# Patient Record
Sex: Male | Born: 1965 | Race: White | Hispanic: No | Marital: Married
Health system: Midwestern US, Community
[De-identification: ages and names within clinical notes are randomized; demographics above are authoritative.]

## PROBLEM LIST (undated history)

## (undated) DIAGNOSIS — M79672 Pain in left foot: Secondary | ICD-10-CM

---

## 2009-01-31 ENCOUNTER — Inpatient Hospital Stay
Admit: 2009-01-31 | Discharge: 2009-01-31 | Disposition: A | Discharge status: Home / Self Care | Attending: Emergency Medicine

## 2009-01-31 MED ORDER — IBUPROFEN 600 MG PO TABS
600 MG | Freq: Once | ORAL | Status: AC
Start: 2009-01-31 — End: 2009-01-31
  Administered 2009-01-31: 17:00:00 600 mg via ORAL

## 2009-01-31 MED ORDER — BACITRACIN-POLYMYXIN B 500-10000 UNIT/GM EX OINT
500-10000 | CUTANEOUS | Status: AC
Start: 2009-01-31 — End: ?

## 2009-01-31 MED ORDER — TETANUS-DIPHTHERIA TOXOIDS TD 2-2 LF/0.5ML IM SUSP
2-2 | INTRAMUSCULAR | Status: AC
Start: 2009-01-31 — End: ?

## 2009-01-31 MED ORDER — TETANUS-DIPHTHERIA TOXOIDS TD 2-2 LF/0.5ML IM SUSP
2-2 LF/0.5ML | Freq: Once | INTRAMUSCULAR | Status: AC
Start: 2009-01-31 — End: 2009-01-31
  Administered 2009-01-31: 17:00:00 0.5 mL via INTRAMUSCULAR

## 2009-01-31 MED ORDER — LIDOCAINE-EPINEPHRINE 1 %-1:100000 IJ SOLN
1 | INTRAMUSCULAR | Status: AC
Start: 2009-01-31 — End: ?

## 2009-01-31 MED ORDER — IBUPROFEN 600 MG PO TABS
600 | ORAL | Status: AC
Start: 2009-01-31 — End: ?

## 2009-01-31 MED FILL — IBUPROFEN 600 MG PO TABS: 600 MG | ORAL | Qty: 1

## 2009-01-31 MED FILL — LIDOCAINE-EPINEPHRINE 1-1:100000 % IJ SOLN: INTRAMUSCULAR | Qty: 20

## 2009-01-31 MED FILL — TETANUS-DIPHTHERIA TOXOIDS TD 2-2 LF/0.5ML IM SUSP: 2-2 LF/0.5ML | INTRAMUSCULAR | Qty: 0.5
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
## 2009-01-31 NOTE — ED Notes (Signed)
Pt to Ct

## 2009-01-31 NOTE — ED Notes (Signed)
Pt returned from CT

## 2009-01-31 NOTE — Discharge Instructions (Signed)
Minor Head Injury   WHAT YOU SHOULD KNOW:   A minor head injury can cause the brain to have trouble working normally for a short time. Minor head injuries are usually not a serious problem. They are most often caused by a blow to the head. A minor head injury may happen because of a fall, a motor vehicle crash, or a sports injury. Sometimes being forcefully shaken may cause a minor head injury.  INSTRUCTIONS:   Medicines:    Keep a written list of what medicines you take, the amounts, and when and why they are taken. Bring the list of your medicines or the pill bottles when you visit your caregiver. Ask your caregiver for more information about the medicines. Do not take any other medicines without first asking your caregiver. This includes prescriptions, over-the-counter drugs, vitamins, herbs, or food supplements.      Always take your medicine as directed by caregivers. Call your caregiver if you think the medicines are not helping. Call if you feel you are having side effects. Do not quit taking the medicines until you discuss it with your caregiver.     Take acetaminophen (a-seet-a-MIN-oh-fen) or ibuprofen (i-bu-PRO-fen) for headache or neck pain if your caregiver says it is OK.   Keep all appointments:   Ask your caregiver when to return for a follow-up visit. Keep all appointments. Write down any questions you may have. This way you will remember to ask these questions during your next visit.  Care:    Waking: You will need to have someone wake you at different times during the night. Ask your caregiver how often you need to be woken up and for how long. Also, have them ask you a few questions to see if you are thinking clearly. An example would be to ask your name or your address.     Ice: Sometimes a blow to the head may cause bruising, swelling, or a cut on your skin. A caregiver may suggest that you use ice to decrease your pain or swelling. It is best to start using ice right after an injury  and up to 24 to 48 hours afterwards. Ask what type of ice treatment to use and how long you should use it. Do not use ice directly on the skin, or for longer than 20 minutes at a time. If ice is not covered or is put on one area of your body for too long, it may cause frostbite.     Rest: Rest in bed or do quiet activities for the first 24 hours. You may begin normal activities again after you feel better.     Activities: Ask your caregiver when you can return to your normal activities such as work or sports.   CONTACT A CAREGIVER IF:    You are vomiting.     You seem more sleepy, or are harder to wake up than usual.     Your symptoms get worse during the first several days after the injury.     You have new headaches that are very bad, or that get worse in the days after the injury.     Your symptoms last longer than six weeks after the injury.   SEEK CARE IMMEDIATELY IF:    You should be seen in an emergency room, doctor's office, or clinic immediately if:      You are vomiting.     You have increasing confusion or a change in personality.       You have blood or clear fluid coming out of the ears or nose.     You do not know where you are, or you do not recognize people that are familiar.     You have new problems with vision (blurry or double vision).     Your speech becomes slurred or confused.     You have arm or leg weakness, loss of feeling, or new problems with coordination (balance and movement).      You or someone with you should dial 9-1-1 or 0 (Operator) for an ambulance if :      Your pupils (black part in the center of the eye) are unequal in size, and this is new for you.     You have a seizure (convulsion).     Someone tries to wake you and cannot do so.     You stop responding others or you pass out (faint).   Copyright  2009. [employer]. All rights reserved. Information is for End User's use only and may not be sold, redistributed or otherwise used for commercial  purposes.  The above information is an educational aid only. It is not intended as medical advice for individual conditions or treatments. Talk to your doctor, nurse or pharmacist before following any medical regimen to see if it is safe and effective for you.    Laceration   WHAT YOU SHOULD KNOW:    A laceration (las-e-RAY-shun) is a cut in the skin. A laceration can happen anywhere on the body. It may be large or small. It may hurt, feel numb, look swollen, and may bleed. The edges of the cut may be wide open or close together. Lacerations are usually caused by being struck or by hitting something sharp. You may get a laceration if you fall or are in an accident.     Lacerations need to be stitched if they are very deep or are bleeding a lot. Stitches may help to keep the wound from getting infected. A stitched wound usually causes less scarring when healed. If you wait too long to be seen by caregivers and the wound is too old, it may not be stitched. Some lacerations may heal better without stitches. The healing time for a laceration depends on where it is on the body. It may take a laceration longer to heal if it is over a joint, such as your knee or elbow.   INSTRUCTIONS:   Medicines:    Keep a list of your medicines: Keep a written list of the medicines you take, the amounts, and when and why you take them. Bring the list of your medicines or the pill bottles when you see your caregivers. Do not take any medicines, over-the-counter drugs, vitamins, herbs, or food supplements without first talking to caregivers.      Take your medicine as directed: Always take your medicine as directed by caregivers. Call your caregiver if you think your medicines are not helping or if you feel you are having side effects. Do not quit taking your medicines until you discuss it with your caregiver.      Antibiotics: This medicine is given to fight or prevent an infection caused by bacteria. Always take your antibiotics  exactly as ordered by your caregiver. Keep taking this medicine until it is completely gone, even if you feel better. Stopping antibiotics without your caregiver's OK may make the medicine unable to kill all of the germs. Never "save" antibiotics or take leftover antibiotics that   were given to you for another illness.      Over-the-counter pain medicine: You may use over-the-counter (OTC) pain medicines, such as ibuprofen or acetaminophen, for pain or swelling. These medicines may be bought without a caregiver's order. These medicines are safe for most people to use. However, they can cause serious problems when they are not used correctly. People with certain medical conditions, or using certain other medicines are at a higher risk for problems. Using too much, or using these medicines for longer than the label says can also cause problems. Follow directions on the label carefully. If you have questions, talk to your caregiver.      Pain medicine: You may be given medicine to take at home to take away or decrease pain. Your caregiver will tell you how much to take and how often to take it. Take the medicine exactly as directed by your caregiver. Do not wait until the pain is too bad before taking your medicine. The medicine may not work as well at controlling your pain if you wait too long to take it. Tell caregivers if the pain medicine does not help, or if your pain comes back too soon.      If a medicine makes you drowsy: Some medicines may make you drowsy (tired) or less able to think clearly. Avoid driving, signing legal papers, operating heavy equipment or other activities that you must be alert to do. Never drink alcohol while you are taking medicines that make you feel drowsy or less alert.   Ask your caregiver when to return for a follow-up visit. Keep all appointments. Write down any questions you may have. This way you will remember to ask these questions during your next visit.  Home care:    Rest,  ice, and elevation: Avoid using or moving the injured area. If possible, raise the wound up on pillows, above the level of your heart. This helps to decrease pain and swelling. Ice may also help to decrease your pain and swelling. It is best to start using ice right after an injury and up to 24 to 48 hours afterwards. Do not use ice directly on the skin, or for longer than 20 minutes at a time. If ice is not covered or is put on one area for too long, it may cause frostbite.     Caring for your wound: Always follow your caregiver's instructions for wound care. Wash your hands with soap and warm water before and after caring for your wound. Your caregiver may want you to keep the wound dry for the first 24 to 48 hours. After that, gently clean the wound once or twice a day with cool water. Use soap to clean around the wound, but try not to get any on the wound edges. Do not use alcohol or hydrogen peroxide to clean your wound unless your caregiver tells you to. Ask your caregiver if you should apply antibiotic ointment to the wound after cleaning it. The following are general tips for wound care.      If the wound is on your hand, lip, mouth, or scalp: If your wound is on your hand, do not do dishes or any activity that causes your hand to soak in water. If the wound is on your scalp, ask your caregiver when you can start washing your hair again. If the wound is on your lip or in your mouth, rinse your mouth after eating or drinking. Ask your caregiver if you should rinse   your mouth with germ-killing mouthwash. Eat soft foods that are easy to chew. Avoid foods that may sting, such as orange juice or hot, spicy foods.     If you need to keep a dressing in place: Your caregiver may put a dressing (bandage) over the wound before you leave. You may need to keep the dressing on for 24 to 48 hours, or until your follow-up visit. Ask your caregiver if it is OK to change your dressing if it becomes soaked with blood. If  you must leave a blood-soaked dressing in place, add clean bandages on top of it. If your wound bleeds more than your caregiver told you to expect, call your caregiver.     If you need to change your bandage yourself: Your caregiver may want you to change your bandage one or more times a day. If the bandage sticks to your wound, use warm water on the bandage and lift it off slowly. Always lift toward the center of your wound, not away from it. Clean the wound and dry the area with a clean towel or gauze before placing a bandage back over it. Then, keep the area as dry and clean as possible.      Keeping your wound uncovered: Many lacerations can be kept uncovered, or "open to air." This allows the wound to stay dry and heal faster. If your caregiver did not tell you to keep a dressing or bandage on your wound, you may leave it uncovered. Use a bandage or dressing over the wound if it is bleeding or has drainage (fluid coming from it). You should also cover the wound if it is in an area where it may get dirty, or where clothes may rub on it.     If your wound was closed with wound tape: Your caregiver may use wound tape or special adhesive strips to hold your wound closed. Examples include butterfly tape or Steri-StripsT. Keep the area clean and dry. The strips will fall off on their own after several days. Sometimes only half of the strip falls off, and you will need to remove the rest of it. If you have to remove a strip, gently pull the edge towards the center of the wound. If only a small part of the tape strip starts to come off, trim the loose part off with blunt-edge scissors. Take care not to cut your skin with the scissors.      If your wound was closed with a tissue adhesive: Caregivers may use a special tissue adhesive to close your wound. This adhesive is a special kind of "glue" that is made to be safely used on skin. Do not use any ointments or lotions on the area. You may shower, but do not swim or  soak in a bathtub. Gently pat-dry the area after showering. Do not pick at or scrub the adhesive area. If the adhesive comes off too soon, call your caregiver. Never use your own adhesive to try and re-glue the wound back together.     If your caregiver wants to wait before closing your wound: Sometimes a wound has to be left open for a few days before it is sutured (sewn) shut by your caregiver. This is called delayed closure. Ask your caregiver for more information about wound care for an open wound, or a wound that is packed with gauze.     If you have sutures (stitches) or staples:      Most wounds may be kept   covered with a non-stick, sterile dressing for the first 24 to 48 hours after treatment. After that, you should gently clean the area as described above. Then, keep the area as dry and clean as possible. Your caregiver may want you to apply antibiotic ointment to the sutures or staples. Ask your caregiver if you should re-cover the area with a clean dressing, or leave it open to air.     Ask your caregiver when you should have your sutures or staples removed. They may need to stay in for five days to three weeks or longer, depending on where the wound is. Sutures on your face may need to be removed sooner. A caregiver will need to check your wound before the sutures or staples are removed. Some sutures will be absorbed by your body and do not need to be removed.      Decrease your chance of scarring: A scar is the mark that stays on your skin after a wound heals. Using antibiotic or other ointment on your wound may decrease the amount of scarring that you have. Ask your caregiver what ointment to buy, and how often to use it. The skin of your wound area may turn a different color if it is exposed to direct sunlight. After your wound is healed, use sunscreen over the area when you are out in the sun. You should do this for at least six months to one year after your injury. Some wounds scar less if they  are covered while they heal. Ask your caregiver if your wound should be covered with a clean bandage, or left open to air.   CONTACT A CAREGIVER IF:    You have a shaking, chills, fever, or any other signs of infection. Signs of infection may include increasing redness, swelling, or warmth around the wound. Other signs that your wound may be infected include a bad smell, red streaks, or pus coming from the wound. Pus is fluid that is milky (not clear), and may be white, yellow, green, or brown.     You have pain in the injured area that does not go away or is getting worse.     Your wound does not heal.     You have skin tearing around your stitches or staples, or if your wound gapes open.     The skin around your wound feels numb.     You have numbness or swelling below the wound.     You cannot move the joint below the wound.     You think you may have problems from your tetanus shot. After a tetanus shot, the shot site may get sore, swollen, red, and warm to touch. This is a normal response to the medicine in the shot. Call your caregiver if these signs last for more than a few days. Call your caregiver if you have severe (very bad) pain in the area where you had your shot.   SEEK CARE IMMEDIATELY IF:    Your symptoms (redness, pain, fever) get worse very quickly.     You have heavy bleeding or bleeding that does not stop after 10 minutes of holding firm, direct pressure over the wound.   Copyright  2009. [employer]. All rights reserved. Information is for End User's use only and may not be sold, redistributed or otherwise used for commercial purposes.  The above information is an educational aid only. It is not intended as medical advice for individual conditions or treatments. Talk to your   doctor, nurse or pharmacist before following any medical regimen to see if it is safe and effective for you.

## 2016-05-14 ENCOUNTER — Inpatient Hospital Stay: Admit: 2016-05-14 | Discharge: 2016-05-14 | Disposition: A | Discharge status: Home / Self Care

## 2016-05-14 ENCOUNTER — Encounter: Admit: 2016-05-14 | Primary: Family Medicine

## 2016-05-14 DIAGNOSIS — W11XXXA Fall on and from ladder, initial encounter: Secondary | ICD-10-CM

## 2016-05-14 MED ORDER — CYCLOBENZAPRINE HCL 10 MG PO TABS
10 MG | Freq: Once | ORAL | Status: AC
Start: 2016-05-14 — End: 2016-05-14
  Administered 2016-05-14: 18:00:00 10 mg via ORAL

## 2016-05-14 MED ORDER — NAPROXEN 500 MG PO TABS
500 MG | ORAL_TABLET | Freq: Two times a day (BID) | ORAL | 0 refills | Status: AC
Start: 2016-05-14 — End: ?

## 2016-05-14 MED ORDER — NAPROXEN 250 MG PO TABS
250 MG | Freq: Once | ORAL | Status: AC
Start: 2016-05-14 — End: 2016-05-14
  Administered 2016-05-14: 18:00:00 500 mg via ORAL

## 2016-05-14 MED ORDER — HYDROCODONE-ACETAMINOPHEN 5-325 MG PO TABS
5-325 MG | ORAL_TABLET | Freq: Four times a day (QID) | ORAL | 0 refills | Status: AC | PRN
Start: 2016-05-14 — End: 2016-05-21

## 2016-05-14 MED ORDER — CYCLOBENZAPRINE HCL 10 MG PO TABS
10 MG | ORAL_TABLET | Freq: Three times a day (TID) | ORAL | 0 refills | Status: AC | PRN
Start: 2016-05-14 — End: 2016-05-24

## 2016-05-14 MED ORDER — OXYCODONE-ACETAMINOPHEN 5-325 MG PO TABS
5-325 MG | Freq: Once | ORAL | Status: AC
Start: 2016-05-14 — End: 2016-05-14
  Administered 2016-05-14: 18:00:00 2 via ORAL

## 2016-05-14 MED FILL — NAPROXEN 250 MG PO TABS: 250 MG | ORAL | Qty: 2

## 2016-05-14 MED FILL — CYCLOBENZAPRINE HCL 10 MG PO TABS: 10 MG | ORAL | Qty: 1

## 2016-05-14 MED FILL — OXYCODONE-ACETAMINOPHEN 5-325 MG PO TABS: 5-325 MG | ORAL | Qty: 2

## 2016-05-14 NOTE — Discharge Instructions (Signed)
Back Pain, Emergency or Urgent Symptoms: Care Instructions  Your Care Instructions  Many people have back pain at one time or another. In most cases, pain gets better with self-care that includes over-the-counter pain medicine, ice, heat, and exercises.  Unless you have symptoms of a severe injury or heart attack, you may be able to give yourself a few days before you call a doctor. But some back problems are very serious. Do not ignore symptoms that need to be checked right away.  Follow-up care is a key part of your treatment and safety. Be sure to make and go to all appointments, and call your doctor if you are having problems. It's also a good idea to know your test results and keep a list of the medicines you take.  How can you care for yourself at home?   Sit or lie in positions that are most comfortable and that reduce your pain. Try one of these positions when you lie down:   Lie on your back with your knees bent and supported by large pillows.   Lie on the floor with your legs on the seat of a sofa or chair.   Lie on your side with your knees and hips bent and a pillow between your legs.   Lie on your stomach if it does not make pain worse.   Do not sit up in bed, and avoid soft couches and twisted positions. Bed rest can help relieve pain at first, but it delays healing. Avoid bed rest after the first day.   Change positions every 30 minutes. If you must sit for long periods of time, take breaks from sitting. Get up and walk around, or lie flat.   Try using a heating pad on a low or medium setting, for 15 to 20 minutes every 2 or 3 hours. Try a warm shower in place of one session with the heating pad. You can also buy single-use heat wraps that last up to 8 hours. You can also try ice or cold packs on your back for 10 to 20 minutes at a time, several times a day. (Put a thin cloth between the ice pack and your skin.) This reduces pain and makes it easier to be active and exercise.   Take pain  medicines exactly as directed.   If the doctor gave you a prescription medicine for pain, take it as prescribed.   If you are not taking a prescription pain medicine, ask your doctor if you can take an over-the-counter medicine.  When should you call for help?  Call 911 anytime you think you may need emergency care. For example, call if:   You are unable to move a leg at all.   You have back pain with severe belly pain.   You have symptoms of a heart attack. These may include:   Chest pain or pressure, or a strange feeling in the chest.   Sweating.   Shortness of breath.   Nausea or vomiting.   Pain, pressure, or a strange feeling in the back, neck, jaw, or upper belly or in one or both shoulders or arms.   Lightheadedness or sudden weakness.   A fast or irregular heartbeat.  After you call 911, the operator may tell you to chew 1 adult-strength or 2 to 4 low-dose aspirin. Wait for an ambulance. Do not try to drive yourself.  Call your doctor now or seek immediate medical care if:   You have new or worse   symptoms in your arms, legs, chest, belly, or buttocks. Symptoms may include:   Numbness or tingling.   Weakness.   Pain.   You lose bladder or bowel control.   You have back pain and:   You have injured your back while lifting or doing some other activity. Call if the pain is severe, has not gone away after 1 or 2 days, and you cannot do your normal daily activities.   You have had a back injury before that needed treatment.   Your pain has lasted longer than 4 weeks.   You have had weight loss you cannot explain.   You are age 51 or older.   You have cancer now or have had it before.  Watch closely for changes in your health, and be sure to contact your doctor if you are not getting better as expected.  Where can you learn more?  Go to [email or web address] and sign in to your MyChart account. Enter S904 in the Search Health Information box to learn more about "Back Pain,  Emergency or Urgent Symptoms: Care Instructions."     If you do not have an account, please click on the "Sign Up Now" link.  Current as of: Feb 16, 2015  Content Version: 11.2   2006-2017 [employer]. Care instructions adapted under license by [facility]. If you have questions about a medical condition or this instruction, always ask your healthcare professional. [employer] disclaims any warranty or liability for your use of this information.

## 2016-05-14 NOTE — ED Provider Notes (Signed)
[facility] ED  eMERGENCY dEPARTMENT eNCOUnter        Pt Name: Grant Stafford  MRN: [id number]  Birthdate 28-Feb-1966  Date of evaluation: 05/14/2016  Provider: Verdon Cummins, PA  PCP: Fermin Schwab, MD  ED Attending: Herminio Commons, MD    CHIEF COMPLAINT       Chief Complaint   Patient presents with   ??? Fall     PT to er reporting that he fell 10 feet off a ladder.  PT reports back pain and left heel pain.         HISTORY OF PRESENT ILLNESS   (Location/Symptom, Timing/Onset, Context/Setting, Quality, Duration, Modifying Factors, Severity)  Note limiting factors.     KERNEY HOPFENSPERGER is a 50 y.o. male with a past medical history of GERD, anxiety, hyperlipidemia and seasonal allergies who presents to the medical fall.  Patient states he was at home cleaning off his gutters.  Patient is a ladder slipped and he fell.  States he fell approximately 10 feet off of a ladder.  States he landed on his left foot/ankle.  Patient states he was able to ambulate after the fall.  States he tried resting and icing the area but had increasing pain.  Patient states pain is worse and taking the ED for further evaluation and treatment.  Patient states he has a continuous aching/soreness rated 9/10 to his left heel, left hip and lower back.  Patient denies any head trauma or loss of consciousness.  Denies any abdominal pain, chest pain, shows breath, nausea/vomiting, headache, visual symptoms, speech disturbances, numbness/tingling, decreased range of motion or decreased strength.  Patient states the nail and plate with difficulty.  Patient denies any abrasion, laceration, edema, ecchymosis, erythema, warmth, fever/chills or deformity at this time.  Denies taking any over-the-counter medication for symptom control.  Denies any previous injury or trauma to the areas listed above in the past.  Denies any radiation of symptoms or other injury/trauma throughout.  Denies any bowel retention, saddle anesthesia or  numbness/tingling.    Nursing Notes were all reviewed and agreed with or any disagreements were addressed  in the HPI.    REVIEW OF SYSTEMS    (2-9 systems for level 4, 10 or more for level 5)     Review of Systems   Constitutional: Negative for activity change, appetite change, chills and fever.   Eyes: Negative for photophobia and visual disturbance.   Respiratory: Negative.  Negative for cough and shortness of breath.    Cardiovascular: Negative.  Negative for chest pain.   Gastrointestinal: Negative for abdominal pain, constipation, diarrhea, nausea and vomiting.   Genitourinary: Negative for difficulty urinating and dysuria.   Musculoskeletal: Positive for arthralgias, back pain and myalgias. Negative for gait problem, joint swelling, neck pain and neck stiffness.   Skin: Negative for color change, pallor, rash and wound.   Neurological: Negative for dizziness, weakness, light-headedness, numbness and headaches.       Positives and Pertinent negatives as per HPI.  Except as noted above in the ROS, all other systems were reviewed and negative.       PAST MEDICAL HISTORY     Past Medical History:   Diagnosis Date   ??? Acid reflux    ??? Anxiety    ??? Hyperlipidemia    ??? Seasonal allergic reaction          SURGICAL HISTORY       Past Surgical History:   Procedure Laterality  Date   ??? TONSILLECTOMY           CURRENT MEDICATIONS       Discharge Medication List as of 05/14/2016  3:35 PM      CONTINUE these medications which have NOT CHANGED    Details   omeprazole (PRILOSEC) 20 MG delayed release capsule Take 20 mg by mouth 2 times dailyHistorical Med      atorvastatin (LIPITOR) 10 MG tablet Take 10 mg by mouth dailyHistorical Med      escitalopram (LEXAPRO) 10 MG tablet Take 10 mg by mouth daily.Historical Med      fluticasone (FLONASE) 50 MCG/ACT nasal spray 1 spray by Nasal route daily.Historical Med               ALLERGIES     Review of patient's allergies indicates no known allergies.    FAMILY HISTORY     History  reviewed. No pertinent family history.       SOCIAL HISTORY       Social History     Social History   ??? Marital status: Married     Spouse name: N/A   ??? Number of children: N/A   ??? Years of education: N/A     Social History Main Topics   ??? Smoking status: Never Smoker   ??? Smokeless tobacco: None   ??? Alcohol use Yes   ??? Drug use: No   ??? Sexual activity: Not Asked     Other Topics Concern   ??? None     Social History Narrative       SCREENINGS             PHYSICAL EXAM    (up to 7 for level 4, 8 or more for level 5)   ED Triage Vitals   BP Temp Temp Source Pulse Resp SpO2 Height Weight   05/14/16 1332 05/14/16 1332 05/14/16 1332 05/14/16 1332 05/14/16 1332 05/14/16 1332 05/14/16 1332 05/14/16 1332   153/94 98.8 ??F (37.1 ??C) Oral 69 16 99 % 6\' 4"  (1.93 m) 210 lb (95.3 kg)       Physical Exam   Constitutional: He is oriented to person, place, and time. He appears well-developed and well-nourished.   HENT:   Head: Normocephalic and atraumatic.   Right Ear: External ear normal.   Left Ear: External ear normal.   Eyes: EOM are normal. Pupils are equal, round, and reactive to light. Right eye exhibits no discharge. Left eye exhibits no discharge.   Neck: Normal range of motion. Neck supple.   Cardiovascular: Normal rate, regular rhythm, normal heart sounds and intact distal pulses.  Exam reveals no gallop and no friction rub.    No murmur heard.  Pulmonary/Chest: Effort normal and breath sounds normal. No stridor. No respiratory distress. He has no wheezes. He has no rales. He exhibits no tenderness.   Abdominal: Soft. He exhibits no distension. There is no tenderness. There is no rebound and no guarding.   Musculoskeletal: Normal range of motion.   Upon examination patient has no tenderness to palpation of the cervical, thoracic or lumbar spine.  No crepitus or deformity.  No pelvis instability.  No anterior chest wall tenderness.  Full range of motion and strength to the upper and lower extremities.  Distal neurovascular  intact.  Patient is tender to palpation over the calcaneus the left foot.  No ankle tenderness.  Dorsalis pedis pulses.  Capillary refill brisk.  Sensation intact to light touch.  Gait  deferred this time.  No proximal fibular or knee tenderness.  No edema, ecchymosis, erythema or warmth noted.  No abrasion or laceration.  No deformity.   Neurological: He is alert and oriented to person, place, and time. He has normal strength. No cranial nerve deficit or sensory deficit. Gait normal. GCS eye subscore is 4. GCS verbal subscore is 5. GCS motor subscore is 6.   Reflex Scores:       Achilles reflexes are 2+ on the right side and 2+ on the left side.  Skin: Skin is warm and dry. No rash noted. He is not diaphoretic. No erythema. No pallor.   Psychiatric: He has a normal mood and affect. His behavior is normal.       DIAGNOSTIC RESULTS   LABS:    Labs Reviewed - No data to display    All other labs were within normal range or not returned as of this dictation.    EKG: All EKG's are interpreted by the Emergency Department Physician who either signs or Co-signs this chart in the absence of a cardiologist.  Please see their note for interpretation of EKG.      RADIOLOGY:   Non-plain film images such as CT, Ultrasound and MRI are read by the radiologist. Plain radiographic images are visualized and preliminarily interpreted by the  ED Provider with the below findings:        Interpretation per the Radiologist below, if available at the time of this note:    XR Foot Left Standard   Final Result   Left ankle:  Subchondral lucency involving the medial aspect of the talar   dome could represent an osteochondral injury, age indeterminate.  MRI would   be useful for further evaluation.      Left foot:  No acute osseous abnormality.         XR Ankle Left Standard   Final Result   Left ankle:  Subchondral lucency involving the medial aspect of the talar   dome could represent an osteochondral injury, age indeterminate.  MRI would    be useful for further evaluation.      Left foot:  No acute osseous abnormality.         XR Pelvis Standard   Final Result   No acute osseous injury is identified.         XR Lumbar Spine Standard Extended VW   Final Result   No acute fracture or subluxation is identified.      Mild spondylosis, most prominent at L1-2.           Xr Lumbar Spine Standard Extended Vw    Result Date: 05/14/2016  EXAMINATION: 5 VIEWS OF THE LUMBAR SPINE 05/14/2016 1:44 pm COMPARISON: None. HISTORY: ORDERING PHYSICIAN PROVIDED HISTORY: Injury TECHNOLOGIST PROVIDED HISTORY: Technologist Provided Reason for Exam: Fall PT to er reporting that he fell 10 feet off a ladder. PT reports back pain and left heel pain. Acuity: Acute Type of Encounter: Unknown FINDINGS: The alignment of the lumbar spine is normal.  At L1-2 there is anterior ossification.  There is rounding of the anterior-inferior corner of L1, considered degenerative.  Mild anterior spurring is otherwise noted at other levels.  No height loss is otherwise identified.  There are no pars defects appreciated.  Transverse processes appear intact.     No acute fracture or subluxation is identified. Mild spondylosis, most prominent at L1-2.     Xr Pelvis Standard    Result Date: 05/14/2016  EXAMINATION: SINGLE VIEW OF THE PELVIS 05/14/2016 1:44 pm COMPARISON: None. HISTORY: ORDERING PHYSICIAN PROVIDED HISTORY: injury TECHNOLOGIST PROVIDED HISTORY: Technologist Provided Reason for Exam: Fall PT to er reporting that he fell 10 feet off a ladder. PT reports back pain and left heel pain. Acuity: Acute Type of Encounter: Unknown FINDINGS: The alignment of the pelvis is normal.  There is no fracture or dislocation. Surgical clips project over the scrotal regions bilaterally.     No acute osseous injury is identified.     Xr Ankle Left Standard    Result Date: 05/14/2016  EXAMINATION: 3 VIEWS OF THE LEFT FOOT; 3 VIEWS OF THE LEFT ANKLE 05/14/2016 1:44 pm COMPARISON: None. HISTORY: ORDERING  PHYSICIAN PROVIDED HISTORY: Injury TECHNOLOGIST PROVIDED HISTORY: Technologist Provided Reason for Exam: Fall PT to er reporting that he fell 10 feet off a ladder. PT reports back pain and left heel pain. Acuity: Acute Type of Encounter: Unknown FINDINGS: Left ankle:  There is a rounded focus of lucency involving the medial aspect of the talar dome.  No evidence of dislocation.  Ankle mortise is symmetric. No focal soft tissue swelling. Left foot:  The there is no evidence of acute fracture or dislocation.  Os peroneum is noted incidentally.  Mild degenerative change in the 1st MTP joint.  Mild soft tissue swelling.     Left ankle:  Subchondral lucency involving the medial aspect of the talar dome could represent an osteochondral injury, age indeterminate.  MRI would be useful for further evaluation. Left foot:  No acute osseous abnormality.     Xr Foot Left Standard    Result Date: 05/14/2016  EXAMINATION: 3 VIEWS OF THE LEFT FOOT; 3 VIEWS OF THE LEFT ANKLE 05/14/2016 1:44 pm COMPARISON: None. HISTORY: ORDERING PHYSICIAN PROVIDED HISTORY: Injury TECHNOLOGIST PROVIDED HISTORY: Technologist Provided Reason for Exam: Fall PT to er reporting that he fell 10 feet off a ladder. PT reports back pain and left heel pain. Acuity: Acute Type of Encounter: Unknown FINDINGS: Left ankle:  There is a rounded focus of lucency involving the medial aspect of the talar dome.  No evidence of dislocation.  Ankle mortise is symmetric. No focal soft tissue swelling. Left foot:  The there is no evidence of acute fracture or dislocation.  Os peroneum is noted incidentally.  Mild degenerative change in the 1st MTP joint.  Mild soft tissue swelling.     Left ankle:  Subchondral lucency involving the medial aspect of the talar dome could represent an osteochondral injury, age indeterminate.  MRI would be useful for further evaluation. Left foot:  No acute osseous abnormality.         PROCEDURES   Unless otherwise noted below, none      Procedures    CRITICAL CARE TIME   N/A    CONSULTS:  None      EMERGENCY DEPARTMENT COURSE and DIFFERENTIAL DIAGNOSIS/MDM:   Vitals:    Vitals:    05/14/16 1332   BP: (!) 153/94   Pulse: 69   Resp: 16   Temp: 98.8 ??F (37.1 ??C)   TempSrc: Oral   SpO2: 99%   Weight: 210 lb (95.3 kg)   Height: 6\' 4"  (1.93 m)       Patient was given the following medications:  Medications   oxyCODONE-acetaminophen (PERCOCET) 5-325 MG per tablet 2 tablet (2 tablets Oral Given 05/14/16 1408)   cyclobenzaprine (FLEXERIL) tablet 10 mg (10 mg Oral Given 05/14/16 1408)   naproxen (NAPROSYN) tablet 500 mg (500 mg  Oral Given 05/14/16 1408)       Patient is a 50 year old male who presents    A fall.  Approximation patient afebrile stable vital signs.  Nontoxic appearance.  Full range of motion and strength throughout.  Distal neurovascular intact.  Given patient's history and physical examination given Percocet, Flexeril and Naprosyn here in the ED. X-ray of the left foot and ankle were obtained and showed subchondral lucency involving the medial aspect of the talar dome which could represent osteochondral injury.  X-ray of the lumbar spine and pelvis were obtained and showed no acute fracture or subluxation.  Given patient's history and physical examination likely suffering from a fall from ladder with associated left foot injury and back pain.  Given patient's history and physical examination will treat empirically for potential occult fracture and subchondral injury.  Patient placed in a posterior OCL splint.  Given crutches.  Instructions ice, elevate and rest the area as much as possible.  Follow-up with orthopedic surgery.  Return ED for any worsening.  Discharged home in stable condition.  Lowest patient for open fracture, dislocation, tendon involvement, nerve involvement, as her compromise, septic arthritis, gout, cellulitis, abscess, Lisfranc injury, Charcot foot, Achilles tendon rupture, proximal fibular fracture, pelvic fracture,  surgical abdomen, AAA, dissection, retroperitoneal bleed, pyelonephritis, nephrolithiasis, testicular etiology, cauda equina, epidural abscess, spinal stenosis, cord compression or other emergent etiology at this time.  Discharged home with a prescription for pain medication, muscle relaxer and anti-inflammatory.    The patient tolerated their visit well.  They were seen and evaluated by the attending physician, Herminio Commons, MD who agreed with the assessment and plan.  The patient and / or the family were informed of the results of any tests, a time was given to answer questions, a plan was proposed and they agreed with plan.        FINAL IMPRESSION      1. Fall from ladder, initial encounter    2. Foot injury, left, initial encounter    3. Acute midline low back pain without sciatica          DISPOSITION/PLAN   DISPOSITION Decision to Discharge    PATIENT REFERRED TO:  Fermin Schwab, MD  [address]  [place] [address]  [phone]    Schedule an appointment as soon as possible for a visit  For a Re-check in  3-5    days.    Richardson Chiquito, MD  [address]  [address]  [place] [address]  [phone]    Schedule an appointment as soon as possible for a visit  For a Re-check in   3-5   days.    [facility] ED  [address]  [place] [address]  [phone]  Go to  As needed, If symptoms worsen      DISCHARGE MEDICATIONS:  Discharge Medication List as of 05/14/2016  3:35 PM      START taking these medications    Details   HYDROcodone-acetaminophen (NORCO) 5-325 MG per tablet Take 1 tablet by mouth every 6 hours as needed for Pain ., Disp-15 tablet, R-0Print      naproxen (NAPROSYN) 500 MG tablet Take 1 tablet by mouth 2 times daily (with meals), Disp-30 tablet, R-0Print      cyclobenzaprine (FLEXERIL) 10 MG tablet Take 1 tablet by mouth 3 times daily as needed for Muscle spasms, Disp-30 tablet, R-0Print  DISCONTINUED MEDICATIONS:  Discharge Medication List as of 05/14/2016  3:35  PM                 (Please note that portions of this note were completed with a voice recognition program.  Efforts were made to edit the dictations but occasionally words are mis-transcribed.)    Verdon Cummins, PA (electronically signed)         Isabella Stalling, PA  05/14/16 1650

## 2016-05-14 NOTE — ED Notes (Signed)
Notified nurse Laney patient is back from x-ray @ 14:07

## 2016-05-14 NOTE — ED Provider Notes (Signed)
I independently performed a history and physical on Grant Stafford.   All diagnostic, treatment, and disposition decisions were made by myself in conjunction with the advanced practice provider.     Briefly, this is a 50 y.o. male here for a fall 10 feet up off of ladder.  The patient landed on his left heel.  He also injured his low back.  The pain is severe, rated 9 out of 10.  He describes it as a soreness..    On exam, the patient has swelling over the heel and ankle.  He has no tenderness to the thoracic or lumbar spine.  There are no palpable step-offs or deformities.      For further details [facility], please see documentation by advanced practice provider  Ronna PolioBen Vulhop, PA.       Herminio CommonsJustin D Kyshawn Teal, MD  05/14/16 [phone]

## 2016-05-20 ENCOUNTER — Ambulatory Visit
Admit: 2016-05-20 | Discharge: 2016-05-20 | Payer: PRIVATE HEALTH INSURANCE | Attending: Orthopaedic Surgery | Primary: Family Medicine

## 2016-05-20 DIAGNOSIS — M79672 Pain in left foot: Secondary | ICD-10-CM

## 2016-05-20 NOTE — Progress Notes (Signed)
CHIEF COMPLAINT: Left foot pain/ foot calcaneus contusion    DATE OF INJURY:  05/14/2016    HISTORY:  Mr.  Bona 50 y.o. caucasian  male  presents today for the first visit for evaluation of a left foot injury which occurred when he fell off a ladder about 11 feet.  He is complaining of left heel pain and swelling.  This is better with elevation and worse with bearing wt.  The pain is sharp and not radiating. No other complaint. He was seen 1st at [facility], where he was x-rayed and splinted and asked to f/u with orthopaedics. He has h/o multiple sprains left ankle with occasional winter time pain.    The patient's past medical history, medications, and review of systems was reviewed.       PHYSICAL EXAMINATION:  Mr. Vandunk is a very pleasant 50 y.o. Caucasian male who presents today in no acute distress, awake, alert, and oriented.  He is well dressed, nourished and  groomed.  Patient with normal affect.  Height is  6\' 4"  (1.93 m), weight is 210 lb (95.3 kg).  Resting respiratory rate is 16.     Examination of the gait, showed that the patient walks with a crutch, PWB left leg .  Examination of both ankles showing a decreased range of motion of the left ankle compare to the other side because of foot pain.  There is mild swelling that can be seen, as well as ecchymosis over lateral side of the left foot.    He  has intact sensation and good pedal pulses.  He has significant tenderness on deep palpation over the left calcaneus.    IMAGING: Skip Mayer were reviewed, Dated 05/14/2016 , 3 views of the left  foot, and showed no displaced fracture.  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    PLAN:  I assured the patient that the xray is negative for acute displaced fracture. We discussed the risk of nondisplaced fracture. I talked to him about the left talus OCD lesion. We applied a short boot today in the office and instructed the patient  in care.  We will see him   back in 4 weeks at which time we will get a new xray of the left foot.           Procedures   ??? Breg Short Gen[employer]us Walking Boot     Patient was prescribed a Breg Short Genisus Walking [employer].  The left foot will require stabilization / immobilization from this semi-rigid / rigid orthosis to improve their function.  The orthosis will assist in protecting the affected area, provide functional support and facilitate healing.    The patient was educated and fit by a Designer, fashion/clothing with expert knowledge and specialization in brace application while under the direct supervision of the physician.  Verbal and written instructions for the use of and application of this item were provided.   They were instructed to contact the office immediately should the brace result in increased pain, decreased sensation, increased swelling or worsening of the condition.       Filbert Berthold, MD

## 2016-06-13 ENCOUNTER — Encounter: Attending: Orthopaedic Surgery | Primary: Family Medicine

## 2016-07-04 ENCOUNTER — Ambulatory Visit
Admit: 2016-07-04 | Discharge: 2016-07-04 | Payer: PRIVATE HEALTH INSURANCE | Attending: Orthopaedic Surgery | Primary: Family Medicine

## 2016-07-04 ENCOUNTER — Ambulatory Visit: Admit: 2016-07-04 | Discharge: 2016-07-14 | Payer: PRIVATE HEALTH INSURANCE | Primary: Family Medicine

## 2016-07-04 DIAGNOSIS — M79672 Pain in left foot: Secondary | ICD-10-CM

## 2016-07-04 NOTE — Progress Notes (Signed)
CHIEF COMPLAINT: Left foot pain/ foot calcaneus contusion    DATE OF INJURY:  05/14/2016    HISTORY:  [name] 50 y.o. caucasian  male  presents today for a follow up visit for evaluation of a left foot injury which occurred when he fell off a ladder about 11 feet and landed on his heels.  He is complaining of left heel pain and swelling that continues to hurt. Rates pain a 4/10 VAS moderate, sharp, intermittent stabbing and show no change. Pain is worse with bearing any weight on heel and better with rest and elevation. Alleviating factors elevation and rest. He was in the boot WB for 2 weeks, then his low back started to hurt worse so he stopped using it. He has h/o multiple sprains left ankle with occasional winter time pain. 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    Past Medical History:   Diagnosis Date   ??? Acid reflux    ??? Anxiety    ??? Hyperlipidemia    ??? Seasonal allergic reaction        Past Surgical History:   Procedure Laterality Date   ??? TONSILLECTOMY         Social History     Social History   ??? Marital status: Married     Spouse name: N/A   ??? Number of children: N/A   ??? Years of education: N/A     Occupational History   ??? Not on file.     Social History Main Topics   ??? Smoking status: Never Smoker   ??? Smokeless tobacco: Never Used   ??? Alcohol use Yes   ??? Drug use: No   ??? Sexual activity: Not on file     Other Topics Concern   ??? Not on file     Social History Narrative   ??? No narrative on file       History reviewed. No pertinent family history.    Current Outpatient Prescriptions on File Prior to Visit   Medication Sig Dispense Refill   ??? omeprazole (PRILOSEC) 20 MG delayed release capsule Take 20 mg by mouth 2 times daily     ??? atorvastatin (LIPITOR) 10 MG tablet Take 10 mg by mouth daily     ??? naproxen (NAPROSYN) 500 MG tablet Take 1 tablet by mouth 2 times daily (with meals) 30 tablet 0   ??? escitalopram (LEXAPRO) 10 MG tablet Take 10 mg by  mouth daily.     ??? fluticasone (FLONASE) 50 MCG/ACT nasal spray 1 spray by Nasal route daily.       No current facility-administered medications on file prior to visit.        Pertinent items are noted in HPI  Review of systems reviewed from Patient History Form dated on 05/20/2016 and available in the patient's chart under the Media tab.        PHYSICAL EXAMINATION:  Grant Stafford is a very pleasant 50 y.o. Caucasian male who presents today in no acute distress, awake, alert, and oriented.  He is well dressed, nourished and  groomed.  Patient with normal affect.  Height is  6\' 4"  (1.93 m), weight is 215 lb (97.5 kg), Body mass index is 26.17 kg/m??.  Resting respiratory rate is 16.     Examination of the gait, showed that the patient walks with a limp PWB left leg .  Examination of both ankles  showing a decreased range of motion of the left ankle compare to the other side because of foot pain.  There is mild swelling that can be seen, no ecchymosis over lateral side of the left foot.    He  has intact sensation and good pedal pulses.  He has significant to moderate tenderness on deep palpation over the mid achilles. He has mild tenderness over the subtalar joint and calcaneus to palpation. Negative Thompson test for achilles rupture.     IMAGING: Skip MayerXray's were reviewed, taken today in the office, 3 views of the left  foot, and showed no displaced fracture.  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    PLAN:  I assured the patient that the xray is negative for acute displaced fracture. We discussed the risk of nondisplaced fracture. I talked to him about the left talus OCD lesion. Since he continues to have pain with weightbearing, recommend getting a MRI left ankle to further evaluate for achilles tendon tear versus nondisplaced fracture. Follow up results.       Filbert BertholdSameh M Jakorey Mcconathy, MD

## 2016-07-11 ENCOUNTER — Inpatient Hospital Stay: Admit: 2016-07-11 | Attending: Orthopaedic Surgery | Primary: Family Medicine

## 2016-07-11 DIAGNOSIS — M79672 Pain in left foot: Secondary | ICD-10-CM

## 2016-07-25 ENCOUNTER — Ambulatory Visit
Admit: 2016-07-25 | Discharge: 2016-07-25 | Payer: PRIVATE HEALTH INSURANCE | Attending: Orthopaedic Surgery | Primary: Family Medicine

## 2016-07-25 DIAGNOSIS — M6788 Other specified disorders of synovium and tendon, other site: Secondary | ICD-10-CM

## 2016-07-25 NOTE — Progress Notes (Signed)
CHIEF COMPLAINT: Left foot pain/ foot calcaneus contusion    DATE OF INJURY:  05/14/2016    HISTORY:  Mr.  Grant Stafford 50 y.o. caucasian  male  presents today for a follow up visit for evaluation of a left foot injury which occurred when he fell off a ladder about 11 feet and landed on his heels and to review MRI results d/t persistent pain.  He reports that his left heel pain and swelling are much improved and he can stand for longer periods of time. He states that his left achilles pain is worse. Rates pain a 3-10/10 VAS moderate, intermittent and show no change. Pain is worse with increased walking and better with rest and elevation. Alleviating factors elevation and rest. He was in the boot WB for 2 weeks, then his low back started to hurt worse so he stopped using it. He has h/o multiple sprains left ankle with occasional winter time pain. 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    Past Medical History:   Diagnosis Date   ??? Acid reflux    ??? Anxiety    ??? Hyperlipidemia    ??? Seasonal allergic reaction        Past Surgical History:   Procedure Laterality Date   ??? TONSILLECTOMY         Social History     Social History   ??? Marital status: Married     Spouse name: N/A   ??? Number of children: N/A   ??? Years of education: N/A     Occupational History   ??? Not on file.     Social History Main Topics   ??? Smoking status: Never Smoker   ??? Smokeless tobacco: Never Used   ??? Alcohol use Yes   ??? Drug use: No   ??? Sexual activity: Not on file     Other Topics Concern   ??? Not on file     Social History Narrative   ??? No narrative on file       History reviewed. No pertinent family history.    Current Outpatient Prescriptions on File Prior to Visit   Medication Sig Dispense Refill   ??? omeprazole (PRILOSEC) 20 MG delayed release capsule Take 20 mg by mouth 2 times daily     ??? atorvastatin (LIPITOR) 10 MG tablet Take 10 mg by mouth daily     ??? naproxen (NAPROSYN) 500 MG tablet Take 1  tablet by mouth 2 times daily (with meals) 30 tablet 0   ??? escitalopram (LEXAPRO) 10 MG tablet Take 10 mg by mouth daily.     ??? fluticasone (FLONASE) 50 MCG/ACT nasal spray 1 spray by Nasal route daily.       No current facility-administered medications on file prior to visit.        Pertinent items are noted in HPI  Review of systems reviewed from Patient History Form dated on 05/20/2016 and available in the patient's chart under the Media tab.        PHYSICAL EXAMINATION:  Mr. Grant Stafford is a very pleasant 50 y.o. Caucasian male who presents today in no acute distress, awake, alert, and oriented.  He is well dressed, nourished and  groomed.  Patient with normal affect.  Height is  6' 2.8" (1.9 m), weight is 214 lb 15.2 oz (97.5 kg), Body mass index is 27.01 kg/m??.  Resting respiratory rate is 16.  Examination of the gait, showed that the patient walks with a mild limp WB left leg .  Examination of both ankles showing a decreased range of motion of the left ankle compare to the other side. There is mild swelling that can be seen over the achilles, no ecchymosis over lateral side of the left foot.    He  has intact sensation and good pedal pulses.  He has moderate tenderness on deep palpation over the mid achilles and no tenderness over the calcaneus. The ankle joint is non tender to palpation. No tenderness over the subtalar joint. Negative Thompson test for achilles rupture.     IMAGING: Skip MayerXray's were reviewed, taken 07/04/2016 in the office, 3 views of the left  foot, and showed no displaced fracture.  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 There is a moderate sized medial talar dome OCD lesion. Thickening of the mid achilles tendon.     IMPRESSION:   1-Left foot calcaneus contusion  2-Left foot noninsertional achilles tendinosis    PLAN:  I assured the patient that the xray and MRI is negative for acute displaced  fracture. We discussed the risk of nondisplaced fracture. I talked to him about the left talus OCD lesion, but this is not bothering him at this point. I discussed with him regarding the achilles tendon tear and he was instructed to work on calf stretching and strengthening exercises which were taught to him today in office. He can be WBAT. No heavy impact activities. He can take NSAIDs, Naprosyn. Follow up in 6 weeks. PT then if needed.       Filbert BertholdSameh M Suresh Audi, MD

## 2016-07-28 ENCOUNTER — Telehealth

## 2016-07-28 MED ORDER — NAPROXEN 500 MG PO TABS
500 MG | ORAL_TABLET | Freq: Two times a day (BID) | ORAL | 0 refills | Status: DC
Start: 2016-07-28 — End: 2016-09-04

## 2016-07-28 NOTE — Telephone Encounter (Signed)
Called and informed Tameem Rx was sent to pharmacy

## 2016-07-28 NOTE — Telephone Encounter (Signed)
Pt was seen 07/25/16 and was told Naprosyn would be called into his pharmacy but the pharmacy doesn't have it   pls call pt thanks

## 2016-09-04 ENCOUNTER — Ambulatory Visit
Admit: 2016-09-04 | Discharge: 2016-09-04 | Payer: PRIVATE HEALTH INSURANCE | Attending: Orthopaedic Surgery | Primary: Family Medicine

## 2016-09-04 DIAGNOSIS — S9032XA Contusion of left foot, initial encounter: Secondary | ICD-10-CM

## 2016-09-05 ENCOUNTER — Encounter: Attending: Orthopaedic Surgery | Primary: Family Medicine

## 2016-09-05 NOTE — Progress Notes (Signed)
CHIEF COMPLAINT: Left foot pain/ foot calcaneus contusion    DATE OF INJURY:  05/14/2016    HISTORY:  Mr.  Grant Stafford 50 y.o. caucasian  male  presents today for a follow up visit for evaluation of a left foot injury which occurred when he fell off a ladder about 11 feet and landed on his heel, he had an MRI d/t persistent pain, which was negative for fracture.  He reports that his left heel pain and swelling are much improved with the stretching exercises. He continue to c/o pain in left heel and it limits the length of time he can walk or stand. He cannot walk heel toe d/t pain and walks with more pressure placed on the lateral foot. Rates pain a 2-3/10 VAS mild, intermittent and persistent. Pain is worse with increased walking and better with rest and elevation. Alleviating factors elevation and rest. He has h/o multiple sprains left ankle with occasional winter time pain. 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    Past Medical History:   Diagnosis Date   ??? Acid reflux    ??? Anxiety    ??? Hyperlipidemia    ??? Seasonal allergic reaction        Past Surgical History:   Procedure Laterality Date   ??? TONSILLECTOMY         Social History     Social History   ??? Marital status: Married     Spouse name: N/A   ??? Number of children: N/A   ??? Years of education: N/A     Occupational History   ??? Not on file.     Social History Main Topics   ??? Smoking status: Never Smoker   ??? Smokeless tobacco: Never Used   ??? Alcohol use Yes   ??? Drug use: No   ??? Sexual activity: Not on file     Other Topics Concern   ??? Not on file     Social History Narrative   ??? No narrative on file       History reviewed. No pertinent family history.    Current Outpatient Prescriptions on File Prior to Visit   Medication Sig Dispense Refill   ??? omeprazole (PRILOSEC) 20 MG delayed release capsule Take 20 mg by mouth 2 times daily     ??? atorvastatin (LIPITOR) 10 MG tablet Take 10 mg by mouth daily     ??? naproxen  (NAPROSYN) 500 MG tablet Take 1 tablet by mouth 2 times daily (with meals) 30 tablet 0   ??? escitalopram (LEXAPRO) 10 MG tablet Take 10 mg by mouth daily.     ??? fluticasone (FLONASE) 50 MCG/ACT nasal spray 1 spray by Nasal route daily.       No current facility-administered medications on file prior to visit.        Pertinent items are noted in HPI  Review of systems reviewed from Patient History Form dated on 05/20/2016 and available in the patient's chart under the Media tab. No change noted       PHYSICAL EXAMINATION:  Mr. Grant Stafford is a very pleasant 50 y.o. Caucasian male who presents today in no acute distress, awake, alert, and oriented.  He is well dressed, nourished and  groomed.  Patient with normal affect.  Height is  6\' 4"  (1.93 m), weight is 223 lb (101.2 kg), Body mass index is 27.14 kg/m??.  Resting respiratory rate is 16.  Examination of the gait, showed that the patient walks with a mild limp WB left leg .  Examination of both ankles showing a full range of motion of the left ankle compare to the other side. There is mild swelling that can be seen over the achilles, no ecchymosis over lateral side of the left foot.  He  has intact sensation and good pedal pulses.  He has mild to no tenderness on deep palpation over the mid achilles and mild tenderness over the calcaneus. The ankle joint is non tender to palpation. No tenderness over the subtalar joint. Negative Thompson test for achilles rupture.     IMAGING: Skip MayerXray's were reviewed, taken 07/04/2016 in the office, 3 views of the left  foot, and showed no displaced fracture.  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 There is a moderate sized medial talar dome OCD lesion. Thickening of the mid achilles tendon.     IMPRESSION:   1-Left foot calcaneus contusion  2-Left foot noninsertional achilles tendinosis    PLAN: I talked to him about the left talus OCD lesion,  but this is not bothering him at this point. He was instructed to work on calf stretching and strengthening exercises. I discussed with the patient that I think that he would really benefit from a course of physical therapy for further strengthening and stretching. He would like to do it on his own.  He can be WBAT. He can return to normal activities. He can take NSAIDs, Naprosyn. Follow up in 2 months, PRN.      Filbert BertholdSameh M Andora Krull, MD

## 2021-05-09 IMAGING — MR MRI RIGHT HIP WITHOUT CONTRAST
5 of 6 series · 23 of 40 positions shown · IV contrast (gadolinium)
Comparison: None

________________________________________________________________________________________________ 
MRI RIGHT HIP WITHOUT CONTRAST, 05/09/2021 [DATE]: 
CLINICAL INDICATION: Pain in right hip. Unilateral primary osteoarthritis, right 
hip.
TECHNIQUE: Multiplanar, multiecho position MR images of the right hip and pelvis 
were performed without and with intravenous gadolinium enhancement. Images were 
reviewed and arthrogram was felt to be non-additive. Patient was scanned on a 3T 
magnet.

[Series 101: survey · axial · 15.0mm · 1.76mm/px · z∈[-25,+224]mm · 2 of 11 slices shown]
[im 1/11]
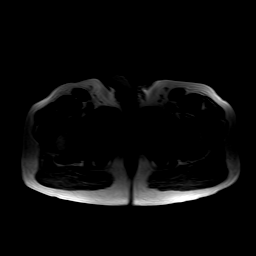
[im 11/11]
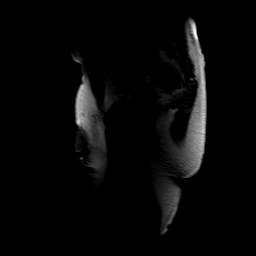

[Series 201: stir_cor · coronal · 5.0mm · 0.49mm/px · 2 of 32 slices shown]
[im 1/32]
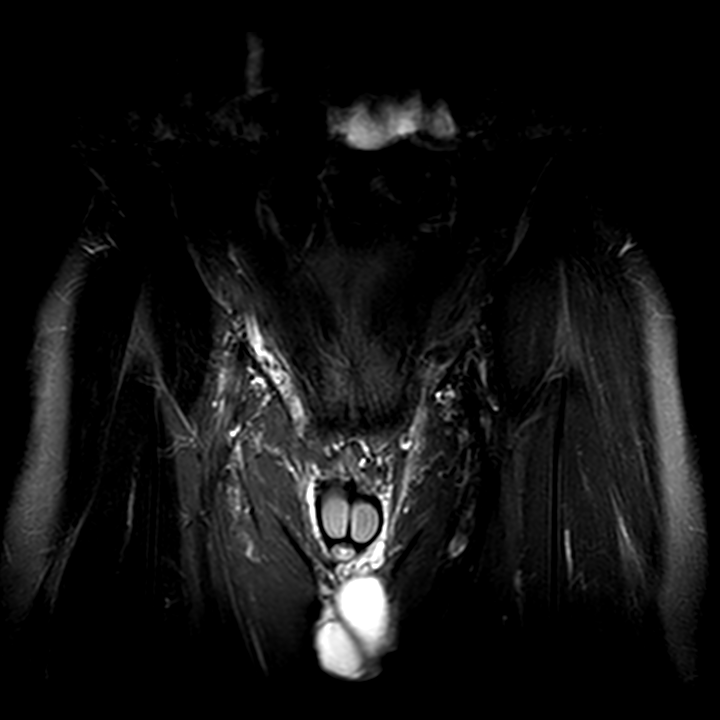
[im 5/32]
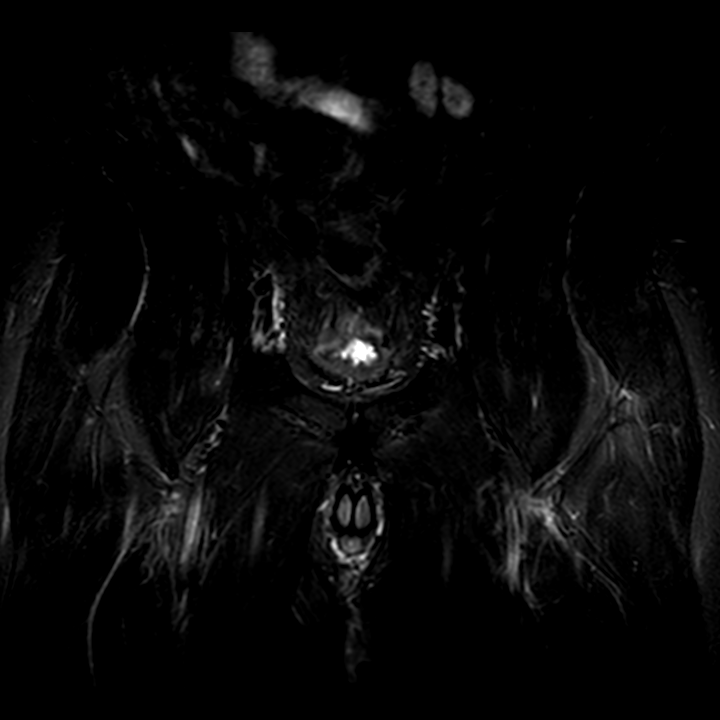

[Series 401: PD fat-sat · axial · 3.0mm · 0.37mm/px · z∈[-43,+29]mm · 6 of 26 slices shown (1 of 2)]
[im 1/26]
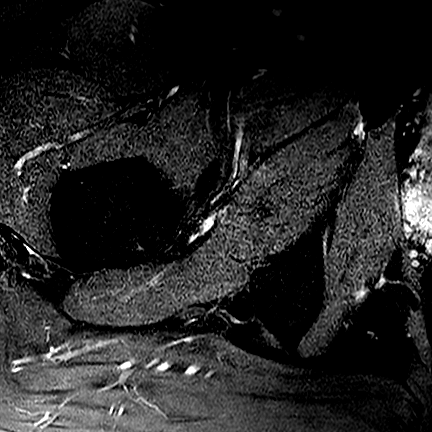
[im 6/26]
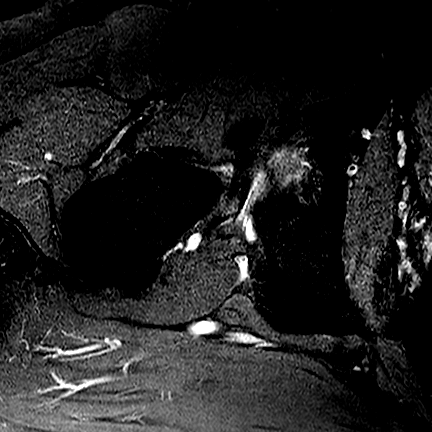
[im 11/26]
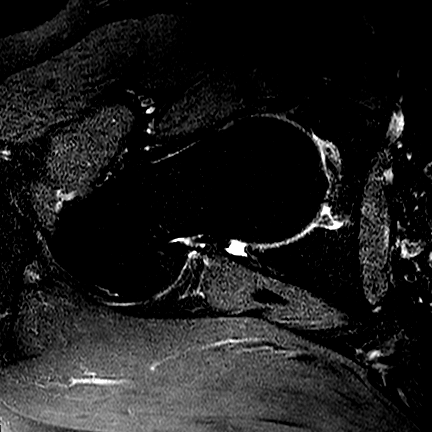
[im 16/26]
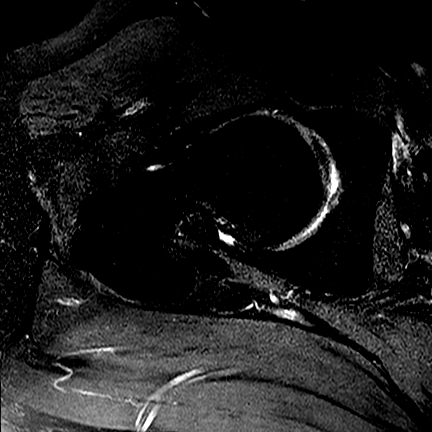
[im 21/26]
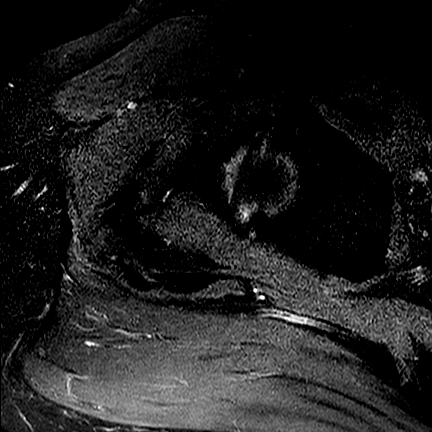
[im 26/26]
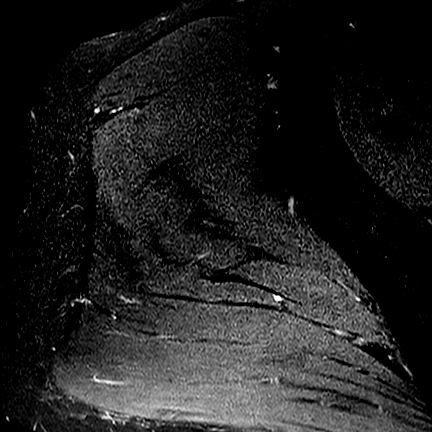

[Series 501: PD fat-sat · coronal · 3.0mm · 0.42mm/px · 5 of 22 slices shown (2 of 2)]
[im 1/22]
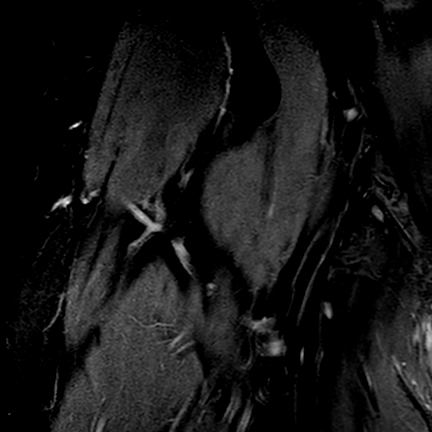
[im 6/22]
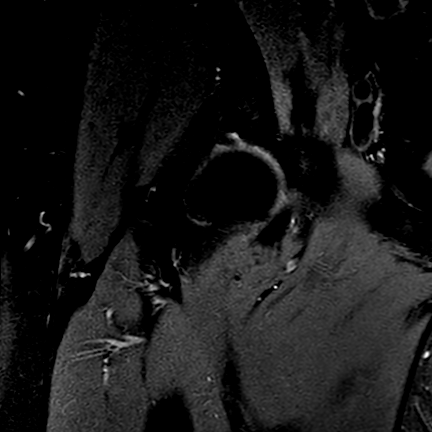
[im 11/22]
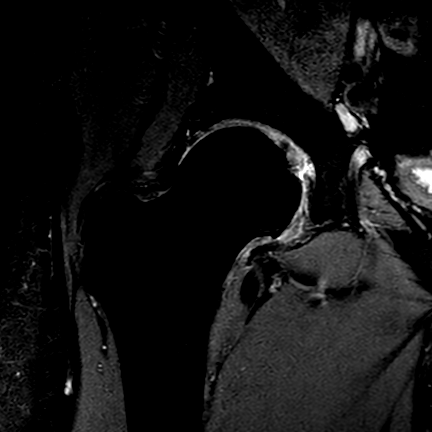
[im 16/22]
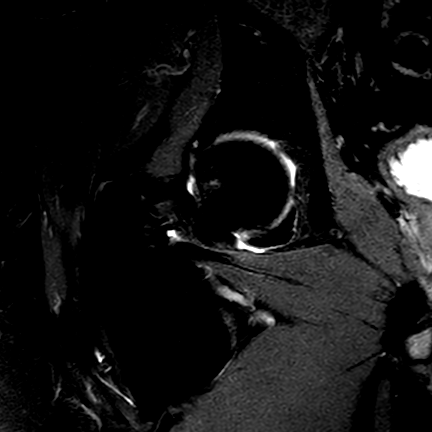
[im 22/22]
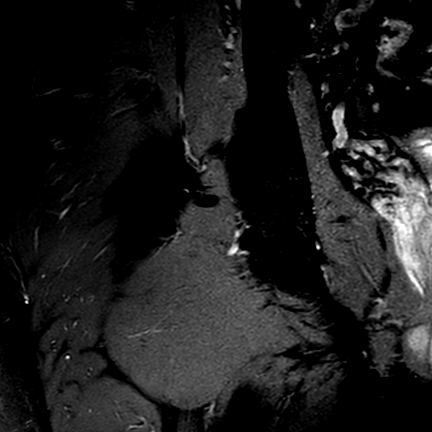

[Series 601: PD · sagittal · 3.5mm · 0.42mm/px · 8 of 32 slices shown]
[im 1/32]
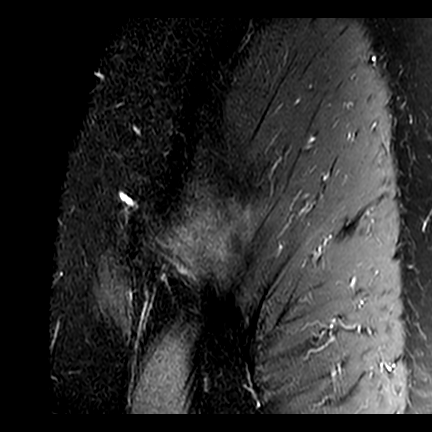
[im 5/32]
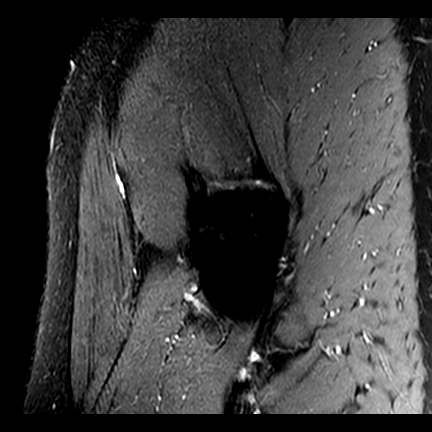
[im 9/32]
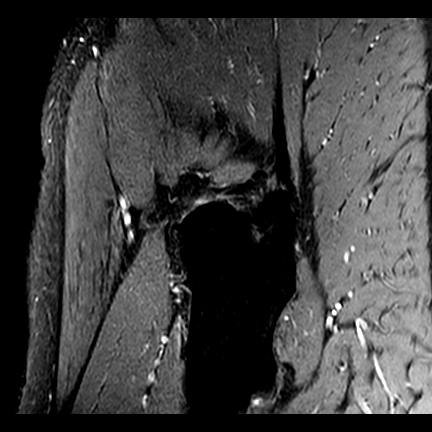
[im 14/32]
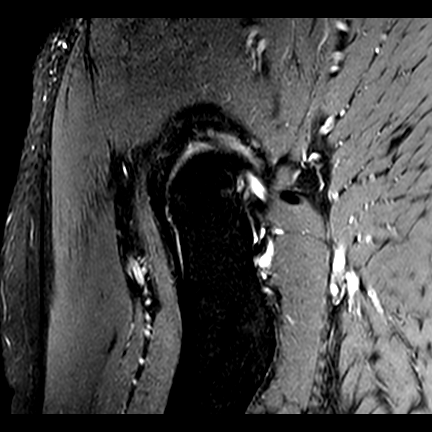
[im 18/32]
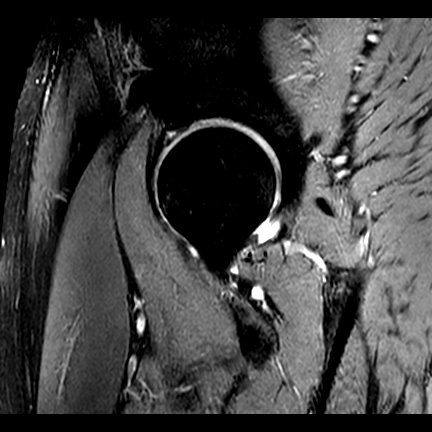
[im 23/32]
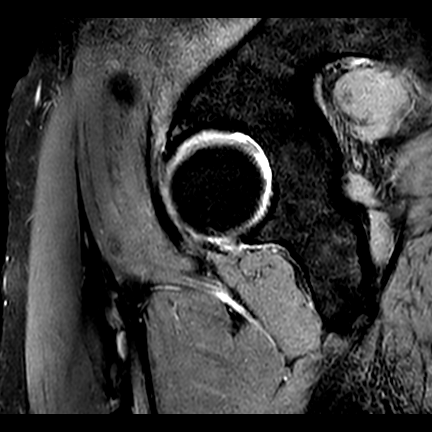
[im 27/32]
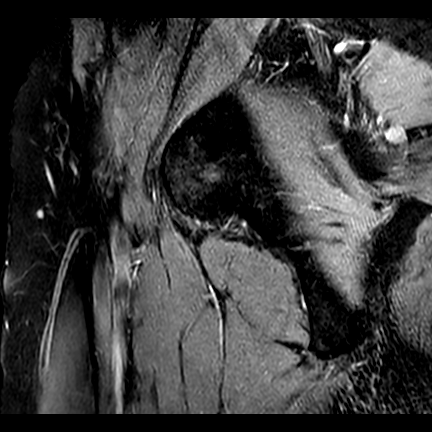
[im 32/32]
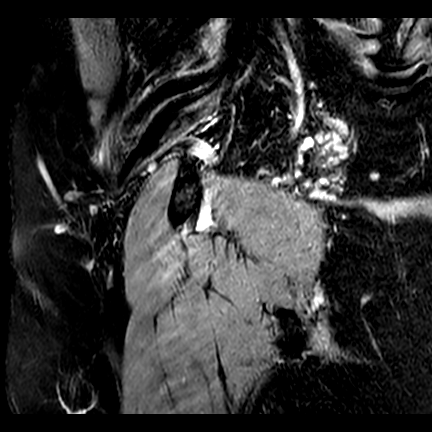

[23 of 40 positions shown; findings below may reference images not displayed]

FINDINGS: HIPS: Bilateral hip joint partial-thickness chondromalacia, acetabular 
osteophytes and subcortical cysts (greater on the left). Tiny right medial 
femoral neck osteophyte and 4 mm focus of subchondral marrow edema. Bilateral 
degenerative anterior and superior labral tears. 1-2 mm right superior 
paralabral cysts. No hip joint effusion. Both femoral heads maintain a spherical 
configuration without evidence of avascular necrosis or subarticular collapse. 
No abnormal morphology of the proximal femurs or acetabulum to predispose to 
impingement. 2 mm right femoral greater trochanteric subcortical cyst. 
PELVIC BONES: Normal marrow signal intensity. No fracture, contusion or marrow 
replacing lesion.  
SI JOINTS: Degenerative change, greater on the right. 
PUBIC SYMPHYSIS: Mild degenerative change. 
SPINE: Mild degenerative change of the spine. 
SOFT TISSUES: The rectus abdominis-adductor aponeurotic complexes are intact. No 
inguinal hernia. Mild tendinosis of the bilateral distal gluteus minimus tendons 
with tendon thickening, intermediate signal and mild peritendinous edema. The 
abductor cuffs are otherwise preserved without high-grade interstitial tear. 
There is trace fluid overlying the greater trochanters without overt 
trochanteric bursitis. The origins of the hamstrings are intact. No mass, free 
fluid or adenopathy. The bowel, bladder and pelvic organs are unremarkable. 
Prostate measures 4.0 x 3.8 x 3.0 cm.
IMPRESSION: 1.  Mild degenerative change of the hips, bilateral degenerative anterior and 
superior labral tears and 1-2 mm right superior paralabral cysts. 
2.  Degenerative change of the SI joints, greater on the right. 
3.  Mild degenerative change of the spine.

## 2021-11-17 IMAGING — MR MRI LEFT ANKLE WITHOUT CONTRAST
5 of 6 series · 27 of 40 positions shown · IV contrast (gadolinium)
Comparison: None.

________________________________________________________________________________________________ 
MRI LEFT ANKLE WITHOUT CONTRAST, 11/17/2021 [DATE]: 
CLINICAL INDICATION: Displaced fracture left talus, initial encounter. Chronic 
left ankle pain.
TECHNIQUE: Multiplanar, multiecho position MR images of the ankle were performed 
without intravenous gadolinium enhancement. Patient was scanned on a
magnet.

[Series 101: survey_fullfov_transversal · axial · 10.0mm · 1.84mm/px · z∈[-51,+51]mm · 3 of 7 slices shown]
[im 1/7]
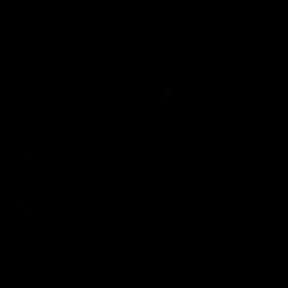
[im 4/7]
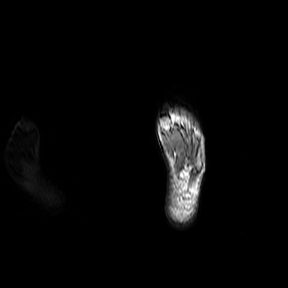
[im 7/7]
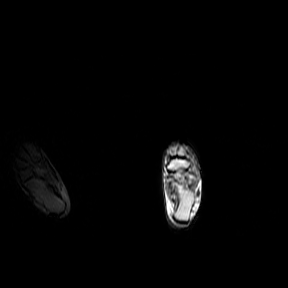

[Series 201: survey_left · axial · 10.0mm · 1.17mm/px · z∈[-20,+149]mm · 2 of 9 slices shown]
[im 1/9]
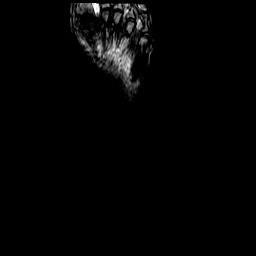
[im 9/9]
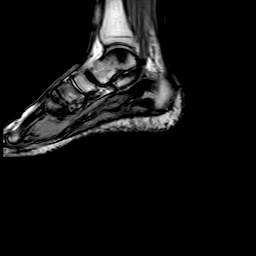

[Series 301: (person_name)_(person_name)_(person_name) · axial · 3.0mm · 0.34mm/px · z∈[-17,+123]mm · 11 of 42 slices shown]
[im 1/42]
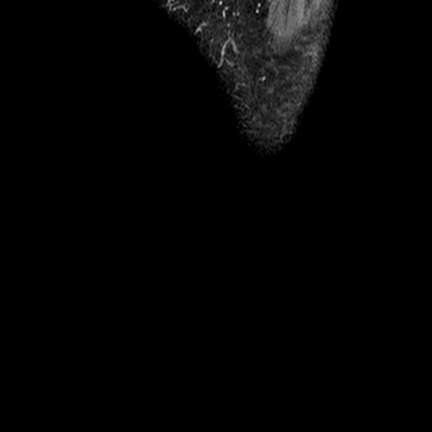
[im 5/42]
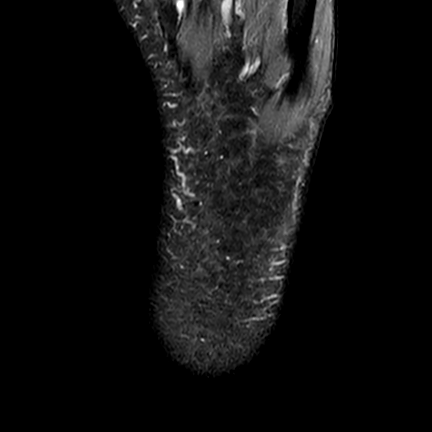
[im 9/42]
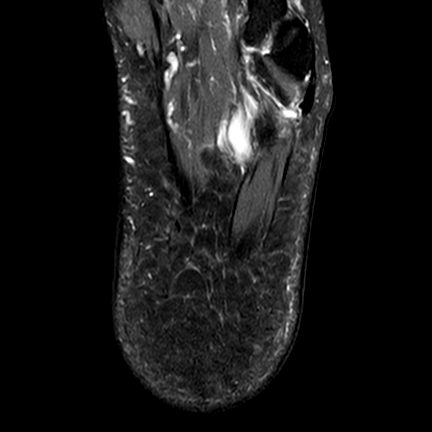
[im 13/42]
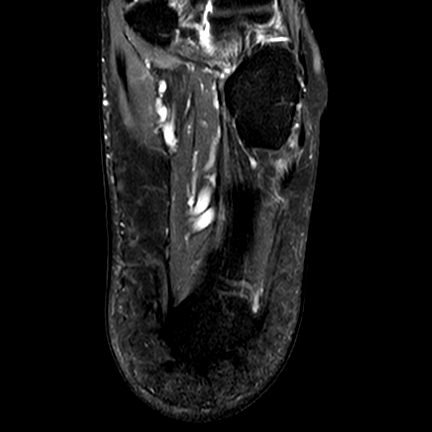
[im 17/42]
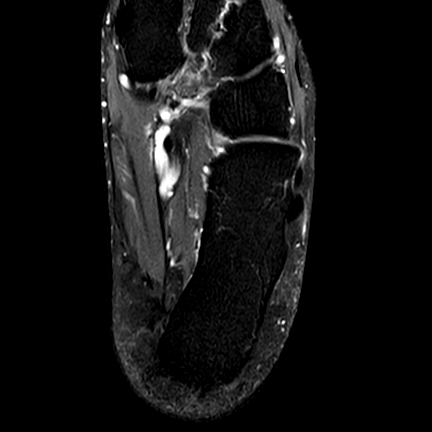
[im 21/42]
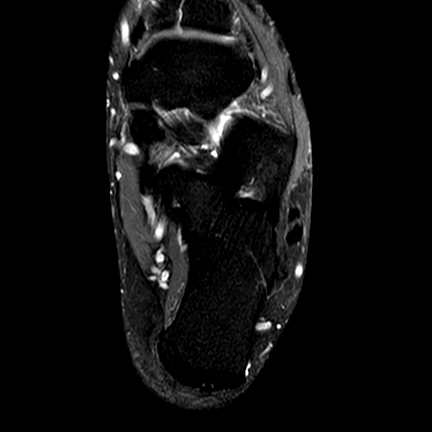
[im 25/42]
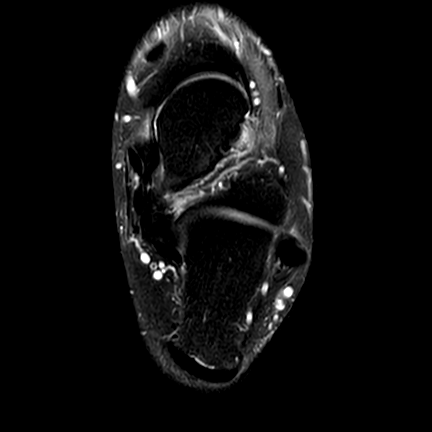
[im 29/42]
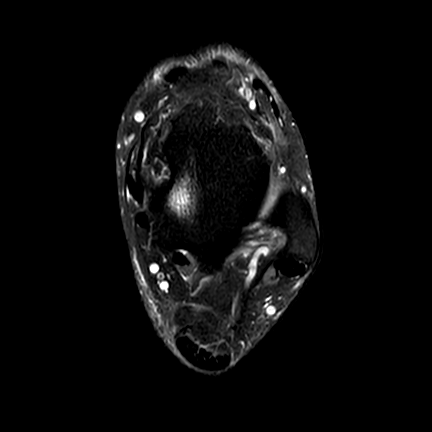
[im 33/42]
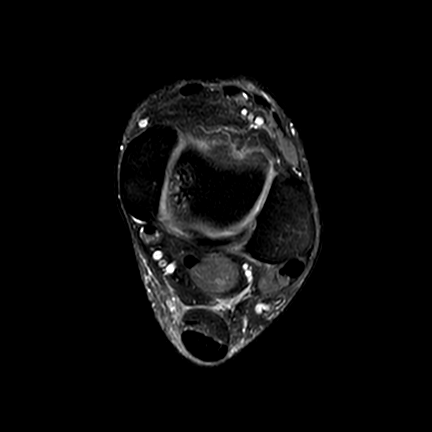
[im 37/42]
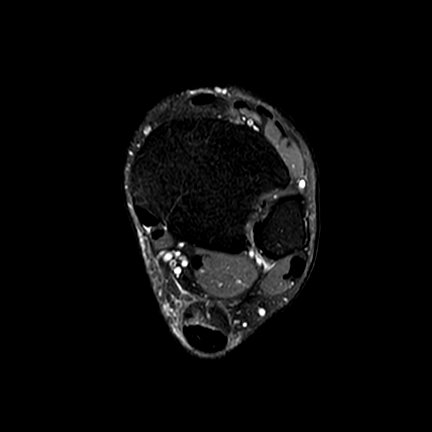
[im 42/42]
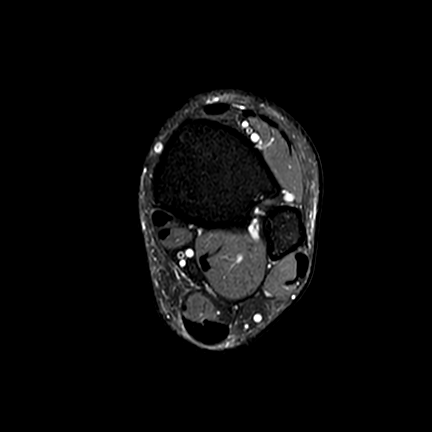

[Series 401: t1_sag · sagittal · 3.0mm · 0.31mm/px · 7 of 27 slices shown]
[im 1/27]
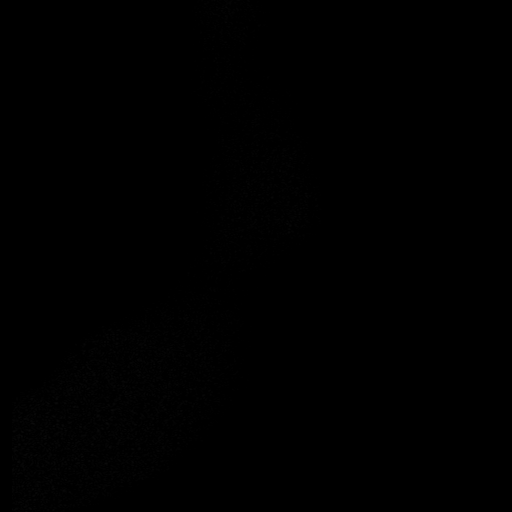
[im 5/27]
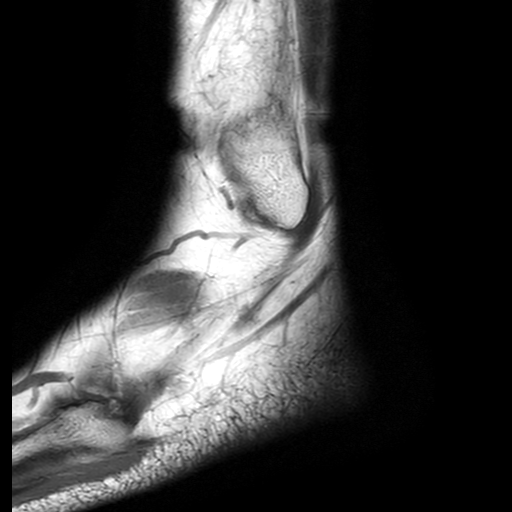
[im 9/27]
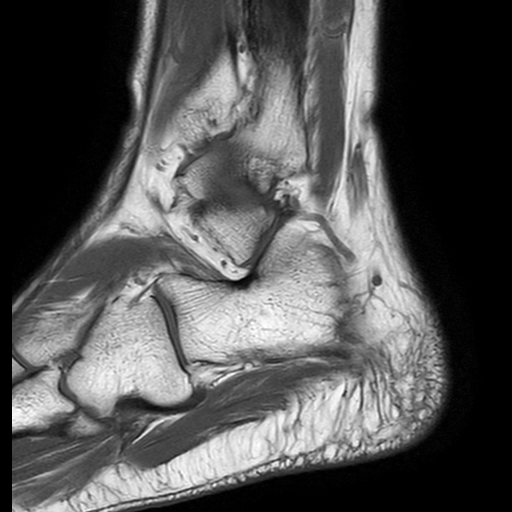
[im 14/27]
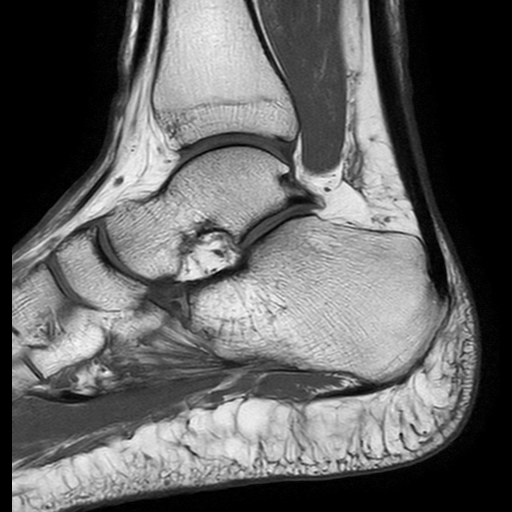
[im 18/27]
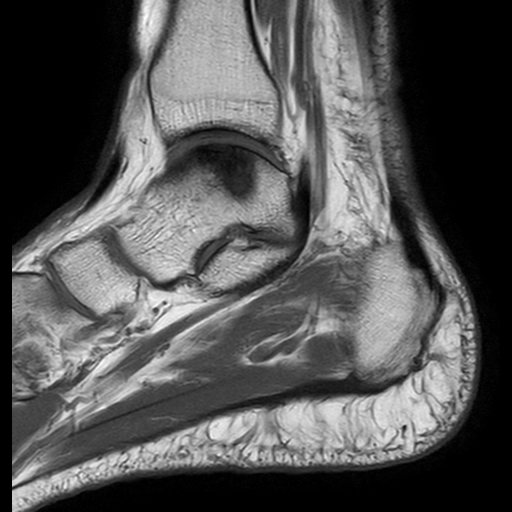
[im 22/27]
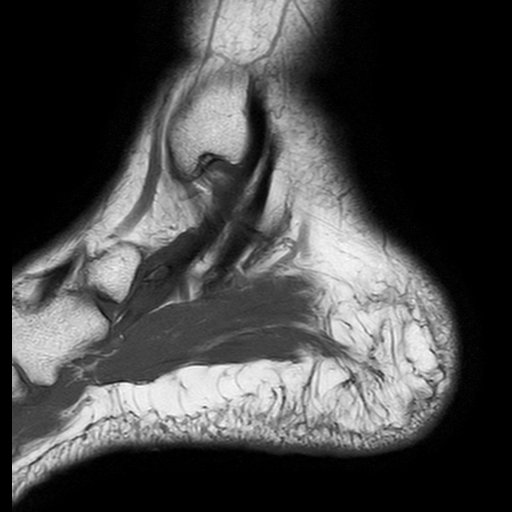
[im 27/27]
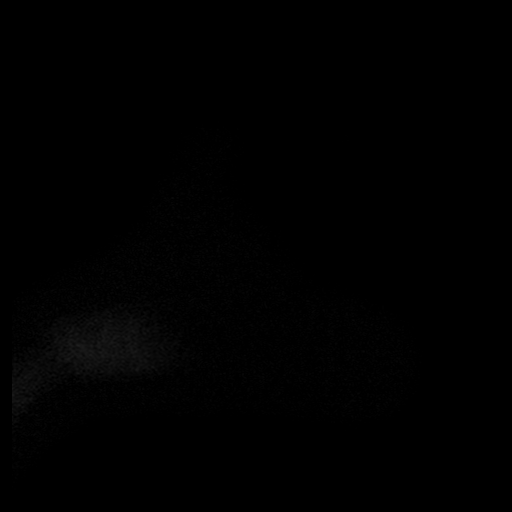

[Series 501: pd_fs_sag · sagittal · 3.0mm · 0.45mm/px · 4 of 27 slices shown]
[im 1/27]
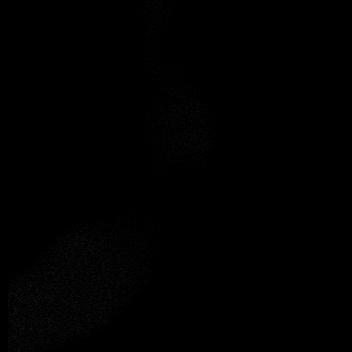
[im 5/27]
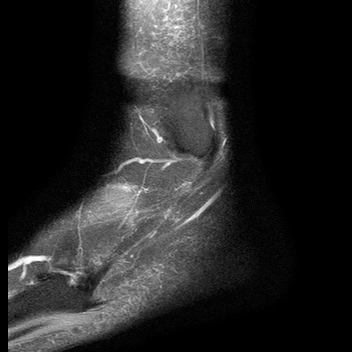
[im 9/27]
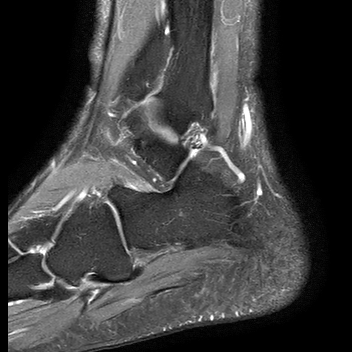
[im 14/27]
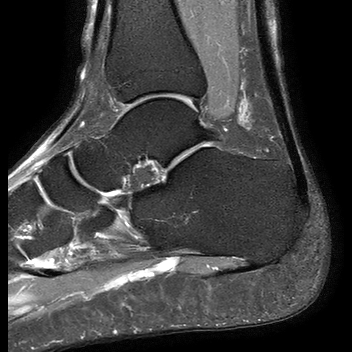

[27 of 40 positions shown; findings below may reference images not displayed]

FINDINGS: TENDONS: Mild thickening (0.8 cm in AP dimensions) of the critical zone of the 
Achilles tendon, consistent with tendinosis. The flexor, extensor and peroneal 
tendons are intact.  
LIGAMENTS:  
LATERAL LIGAMENTS: The anterior talofibular ligament is intact. The 
calcaneofibular ligament and posterior talofibular ligament are preserved. 
SYNDESMOTIC LIGAMENTS: The anterior and posterior tibiofibular and interosseous 
ligaments are preserved. 
DELTOID LIGAMENTOUS COMPLEX: The deep and superficial components of the deltoid 
ligamentous complex are intact. 
SINUS TARSI LIGAMENTS: The cervical and interosseous ligaments are preserved. 
The inferior extensor retinaculum appears intact.  
JAHID ISLAM LIGAMENTOUS COMPLEX: Plantar calcaneonavicular ligament preserved. 
BONES AND JOINTS: Medial talar dome osteochondral lesion with 0.6 cm focus of 
cortical flattening and overlying cartilage defect, subcortical cysts (up to
cm) and 1.9 x 0.8 x 1.8 cm focus of subchondral marrow edema. No displaced 
fractures identified. 
Remainder of the ankle joint is preserved. Subtalar joint and midfoot are 
preserved.  
ADDITIONAL FINDINGS: Musculature is symmetric without mass, signal abnormality 
or atrophy. Sinus tarsi fat is preserved. Plantar fascia is intact. Tarsal 
tunnel is preserved. Mild posterior soft tissue swelling..
IMPRESSION: 1.  Medial talar dome osteochondral lesion with 0.6 cm focus of cortical 
flattening and overlying cartilage defect, subcortical cysts (up to 0.6 cm) and 
1.9 x 0.8 x 1.8 cm focus of subchondral marrow edema.  
2.  No displaced fractures identified. 
3.  Mild Achilles tendinosis. 
4.  Mild posterior soft tissue swelling.
